# Patient Record
Sex: Male | Born: 1995 | Race: White | Hispanic: No | Marital: Single | Smoking: Never smoker
Health system: Southern US, Community
[De-identification: ages and names within clinical notes are randomized; demographics above are authoritative.]

## PROBLEM LIST (undated history)

## (undated) DIAGNOSIS — J45909 Unspecified asthma, uncomplicated: Secondary | ICD-10-CM

---
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
## 2018-01-06 ENCOUNTER — Emergency Department (HOSPITAL_COMMUNITY)
Admission: EM | Admit: 2018-01-06 | Discharge: 2018-01-06 | Disposition: A | Discharge status: Home / Self Care | Payer: Self-pay | Attending: Emergency Medicine | Admitting: Emergency Medicine

## 2018-01-06 ENCOUNTER — Other Ambulatory Visit: Payer: Self-pay

## 2018-01-06 ENCOUNTER — Emergency Department (HOSPITAL_COMMUNITY): Payer: Self-pay

## 2018-01-06 ENCOUNTER — Encounter (HOSPITAL_COMMUNITY): Payer: Self-pay | Admitting: Emergency Medicine

## 2018-01-06 DIAGNOSIS — R569 Unspecified convulsions: Secondary | ICD-10-CM | POA: Insufficient documentation

## 2018-01-06 DIAGNOSIS — J45909 Unspecified asthma, uncomplicated: Secondary | ICD-10-CM | POA: Insufficient documentation

## 2018-01-06 LAB — CBC WITH DIFFERENTIAL/PLATELET
ABS IMMATURE GRANULOCYTES: 0 10*3/uL (ref 0.0–0.1)
BASOS ABS: 0.1 10*3/uL (ref 0.0–0.1)
BASOS PCT: 1 %
Eosinophils Absolute: 0.1 10*3/uL (ref 0.0–0.7)
Eosinophils Relative: 1 %
HCT: 46.3 % (ref 39.0–52.0)
HEMOGLOBIN: 15.7 g/dL (ref 13.0–17.0)
Immature Granulocytes: 0 %
LYMPHS PCT: 23 %
Lymphs Abs: 2.1 10*3/uL (ref 0.7–4.0)
MCH: 32.4 pg (ref 26.0–34.0)
MCHC: 33.9 g/dL (ref 30.0–36.0)
MCV: 95.5 fL (ref 78.0–100.0)
Monocytes Absolute: 0.7 10*3/uL (ref 0.1–1.0)
Monocytes Relative: 7 %
NEUTROS ABS: 6.1 10*3/uL (ref 1.7–7.7)
NEUTROS PCT: 68 %
PLATELETS: 202 10*3/uL (ref 150–400)
RBC: 4.85 MIL/uL (ref 4.22–5.81)
RDW: 11.4 % — ABNORMAL LOW (ref 11.5–15.5)
WBC: 9.1 10*3/uL (ref 4.0–10.5)

## 2018-01-06 LAB — URINALYSIS, ROUTINE W REFLEX MICROSCOPIC
BILIRUBIN URINE: NEGATIVE
Glucose, UA: NEGATIVE mg/dL
Hgb urine dipstick: NEGATIVE
Ketones, ur: NEGATIVE mg/dL
Leukocytes, UA: NEGATIVE
NITRITE: NEGATIVE
PROTEIN: NEGATIVE mg/dL
SPECIFIC GRAVITY, URINE: 1.011 (ref 1.005–1.030)
pH: 7 (ref 5.0–8.0)

## 2018-01-06 LAB — RAPID URINE DRUG SCREEN, HOSP PERFORMED
Amphetamines: NOT DETECTED
Barbiturates: NOT DETECTED
Benzodiazepines: NOT DETECTED
COCAINE: NOT DETECTED
OPIATES: NOT DETECTED
TETRAHYDROCANNABINOL: POSITIVE — AB

## 2018-01-06 LAB — COMPREHENSIVE METABOLIC PANEL
ALT: 15 U/L (ref 0–44)
ANION GAP: 8 (ref 5–15)
AST: 17 U/L (ref 15–41)
Albumin: 4.3 g/dL (ref 3.5–5.0)
Alkaline Phosphatase: 45 U/L (ref 38–126)
BUN: 12 mg/dL (ref 6–20)
CO2: 26 mmol/L (ref 22–32)
Calcium: 9.3 mg/dL (ref 8.9–10.3)
Chloride: 106 mmol/L (ref 98–111)
Creatinine, Ser: 1.07 mg/dL (ref 0.61–1.24)
Glucose, Bld: 92 mg/dL (ref 70–99)
POTASSIUM: 3.6 mmol/L (ref 3.5–5.1)
Sodium: 140 mmol/L (ref 135–145)
Total Bilirubin: 1.3 mg/dL — ABNORMAL HIGH (ref 0.3–1.2)
Total Protein: 6.7 g/dL (ref 6.5–8.1)

## 2018-01-06 LAB — ETHANOL

## 2018-01-06 MED ORDER — SODIUM CHLORIDE 0.9 % IV BOLUS
1000.0000 mL | Freq: Once | INTRAVENOUS | Status: AC
  Administered 2018-01-06: 1000 mL via INTRAVENOUS

## 2018-01-06 NOTE — ED Notes (Signed)
Pt ambulated without difficulty. Gait steady and even. Patient verbalizes understanding of discharge instructions. Opportunity for questioning and answers were provided.

## 2018-01-06 NOTE — Discharge Instructions (Addendum)
Follow-up with the referred neurology for evaluation of possible seizure activity.   Do not drive until you have been evaluated by neurology.   Low up with your primary care doctor in the next 2 to 4 days for further evaluation.  Return the emergency department for any pain, vision change, difficulty walking, numbness/weakness of your arms or legs, chest pain, difficulty breathing or any other worsening or concerning symptoms.
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
## 2018-01-06 NOTE — ED Triage Notes (Signed)
Patient arrived via EMS. Seizure-like activity reported at gas station. Postictal state, presents drowsy. One incontinent episode. Reports not remembering for a moment at gas station, and felt as if he could not breath. Currently alert X4. PA at bedside. Seizure pads placed on bed. Denies pain.

## 2019-03-08 ENCOUNTER — Emergency Department
Admission: EM | Admit: 2019-03-08 | Discharge: 2019-03-08 | Disposition: A | Discharge status: Home / Self Care | Payer: 59 | Attending: Student | Admitting: Student

## 2019-03-08 ENCOUNTER — Other Ambulatory Visit: Payer: Self-pay

## 2019-03-08 ENCOUNTER — Encounter: Payer: Self-pay | Admitting: Emergency Medicine

## 2019-03-08 DIAGNOSIS — J45909 Unspecified asthma, uncomplicated: Secondary | ICD-10-CM | POA: Insufficient documentation

## 2019-03-08 DIAGNOSIS — H1033 Unspecified acute conjunctivitis, bilateral: Secondary | ICD-10-CM | POA: Insufficient documentation

## 2019-03-08 DIAGNOSIS — H11433 Conjunctival hyperemia, bilateral: Secondary | ICD-10-CM | POA: Diagnosis present

## 2019-03-08 NOTE — Discharge Instructions (Addendum)
Follow-up with [facility] at 1:30 PM today.  Do not use any more eyedrops.

## 2019-03-08 NOTE — ED Triage Notes (Signed)
Presents with right eye drainage and swelling  Was seen at [facility] last Thursday  Placed on meds   But doesn't feel like it is working

## 2019-03-08 NOTE — ED Provider Notes (Signed)
[facility] Emergency Department Provider Note  ____________________________________________   First MD Initiated Contact with Patient 03/08/19 1055     (approximate)  I have reviewed the triage vital signs and the nursing notes.   HISTORY  Chief Complaint Eye Pain    HPI Adam Jenkins is a 23 y.o. male presents emergency department complaint of eye drainage and swelling.  He was seen last Thursday at [facility].  They started him on Cipro eyedrops and clindamycin oral medication.  They thought maybe it was something to do with his ducts.  He denies any known eye injury.  He states his eyes still hurt and he is very sensitive to light.  No fever or chills.    Past Medical History:  Diagnosis Date  . Asthma     Patient Active Problem List   Diagnosis Date Noted  . VISUAL ACUITY, DECREASED 01/06/2010    History reviewed. No pertinent surgical history.  Prior to Admission medications   Medication Sig Start Date End Date Taking? Authorizing Provider  clindamycin (CLEOCIN) 300 MG capsule Take 300 mg by mouth 3 (three) times daily.   Yes [provider]  diphenhydrAMINE (BENADRYL) 25 MG tablet Take 25 mg by mouth every 6 (six) hours as needed for allergies (cough).    [provider]    Allergies Patient has no known allergies.  No family history on file.  Social History Social History   Tobacco Use  . Smoking status: Never Smoker  . Smokeless tobacco: Never Used  Substance Use Topics  . Alcohol use: Yes    Comment: occassionally  . Drug use: No    Review of Systems  Constitutional: No fever/chills Eyes: No visual changes.  Positive photophobia and eye redness with clear watery drainage ENT: No sore throat. Respiratory: Denies cough Genitourinary: Negative for dysuria. Musculoskeletal: Negative for back pain. Skin: Negative for rash.    ____________________________________________   PHYSICAL EXAM:   VITAL SIGNS: ED Triage Vitals  Enc Vitals Group     BP 03/08/19 1110 126/75     Pulse Rate 03/08/19 1110 78     Resp 03/08/19 1110 18     Temp 03/08/19 1110 98 F (36.7 C)     Temp Source 03/08/19 1110 Oral     SpO2 03/08/19 1110 99 %     Weight 03/08/19 1106 151 lb (68.5 kg)     Height 03/08/19 1106 5\' 9"  (1.753 m)     Head Circumference --      Peak Flow --      Pain Score 03/08/19 1106 2     Pain Loc --      Pain Edu? --      Excl. in GC? --     Constitutional: Alert and oriented. Well appearing and in no acute distress. Eyes: Conjunctivae are injected bilaterally, clear drainage is noted from both eyes.  No periorbital edema is noted.   Head: Atraumatic. Nose: No congestion/rhinnorhea. Mouth/Throat: Mucous membranes are moist.   Neck:  supple no lymphadenopathy noted Cardiovascular: Normal rate, regular rhythm. Heart sounds are normal Respiratory: Normal respiratory effort.  No retractions, lungs c t a  GU: deferred Musculoskeletal: FROM all extremities, warm and well perfused Neurologic:  Normal speech and language.  Skin:  Skin is warm, dry and intact. No rash noted. Psychiatric: Mood and affect are normal. Speech and behavior are normal.  ____________________________________________   LABS (all labs ordered are listed, but only abnormal results are displayed)  Labs Reviewed - No data to display ____________________________________________   ____________________________________________  RADIOLOGY    ____________________________________________   PROCEDURES  Procedure(s) performed: No  Procedures    ____________________________________________   INITIAL IMPRESSION / ASSESSMENT AND PLAN / ED COURSE  Pertinent labs & imaging results that were available during my care of the patient were reviewed by me and considered in my medical decision making (see chart for details).   Patient is a 23 year old male presents emergency department complaining of  worsening conjunctivitis.  Patient has been on Cipro ophthalmic drops since Thursday of last week and clindamycin orally.  Physical exam shows diffuse redness of conjunctiva in both eyes with clear watery drainage.  Patient is very sensitive to light.  Paged Dr. Wallace Going.  He will see the patient in the office this afternoon.  Stop all eyedrops at this time.  Explained this to the patient.  He is going to follow-up Dr. Christin Fudge at 1:30 PM today.  He was discharged in stable condition.    Adam Jenkins was evaluated in Emergency Department on 03/08/2019 for the symptoms described in the history of present illness. He was evaluated in the context of the global COVID-19 pandemic, which necessitated consideration that the patient might be at risk for infection with the SARS-CoV-2 virus that causes COVID-19. Institutional protocols and algorithms that pertain to the evaluation of patients at risk for COVID-19 are in a state of rapid change based on information released by regulatory bodies including the CDC and federal and state organizations. These policies and algorithms were followed during the patient's care in the ED.   As part of my medical decision making, I reviewed the following data within the [facility] notes reviewed and incorporated, Old chart reviewed, A consult was requested and obtained from this/these consultant(s) ophthalmology, Notes from prior ED visits and [place] Controlled Substance Database  ____________________________________________   FINAL CLINICAL IMPRESSION(S) / ED DIAGNOSES  Final diagnoses:  Acute conjunctivitis of both eyes, unspecified acute conjunctivitis type      NEW MEDICATIONS STARTED DURING THIS VISIT:  New Prescriptions   No medications on file     Note:  This document was prepared using Dragon voice recognition software and may include unintentional dictation errors.    Versie Starks, PA-C 03/08/19 1208    Lilia Pro., MD 03/08/19 [phone]

## 2019-12-22 IMAGING — CT CT HEAD W/O CM
3 series · 15 of 47 positions shown, 18 images · non-contrast
Comparison: September 19, 2012

CLINICAL DATA: Acute onset seizure

EXAM:
CT HEAD WITHOUT CONTRAST
TECHNIQUE: Contiguous axial images were obtained from the base of the skull
through the vertex without intravenous contrast.

[Series 3: head 5.0 h30s · axial · 0.42mm/px · z∈[-52,+73]mm · 9 of 31 slices shown, 12 images]
[im 3/31  brain]
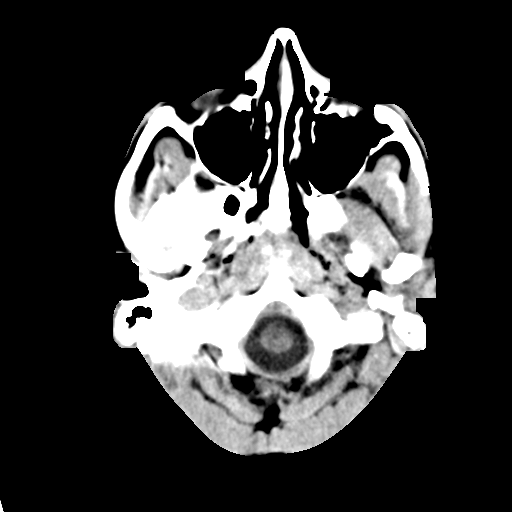
[im 3/31  bone]
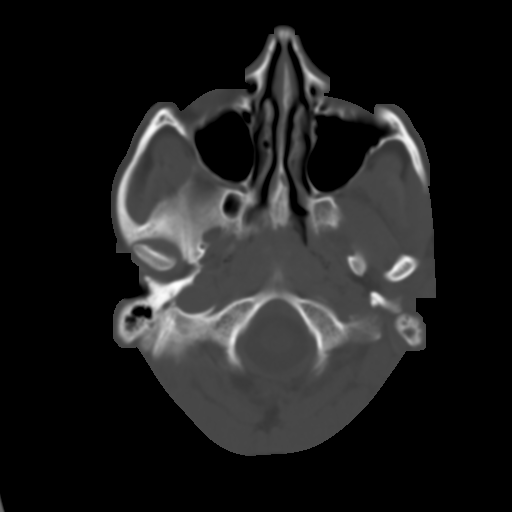
[im 6/31  brain]
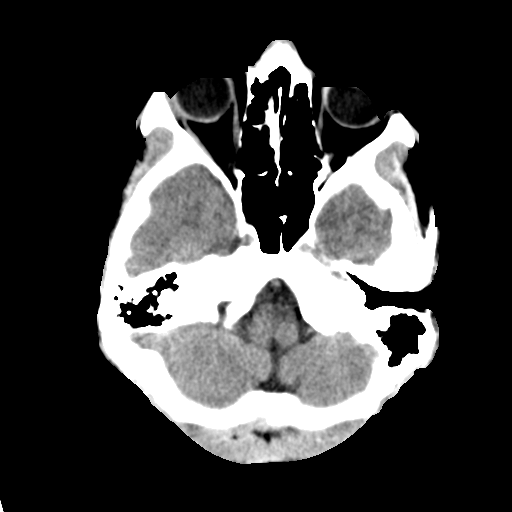
[im 9/31  brain]
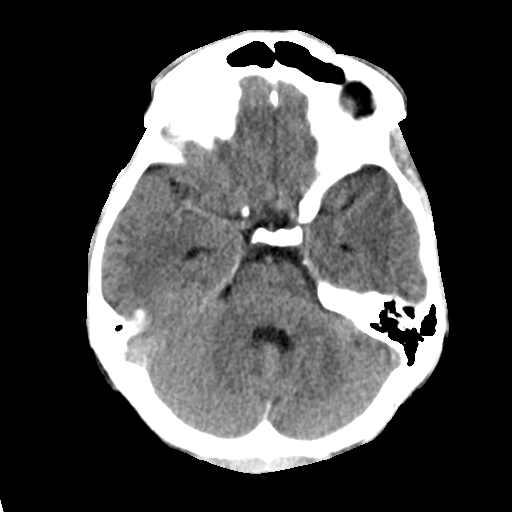
[im 12/31  brain]
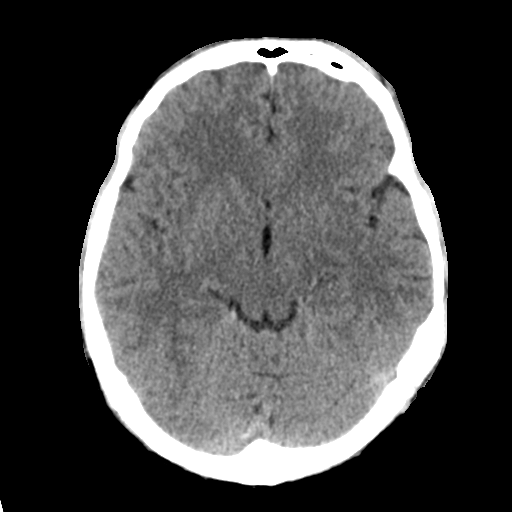
[im 16/31  brain]
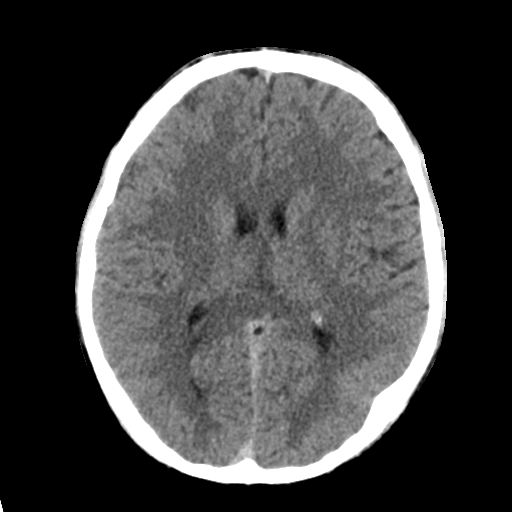
[im 16/31  bone]
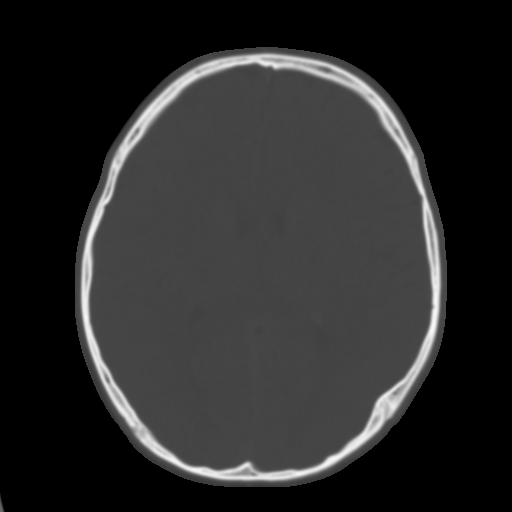
[im 19/31  brain]
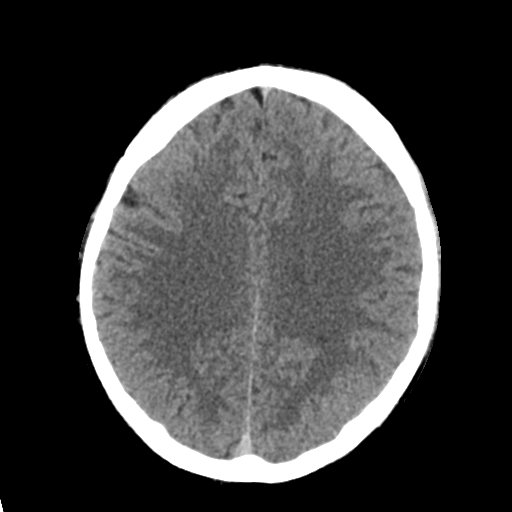
[im 22/31  brain]
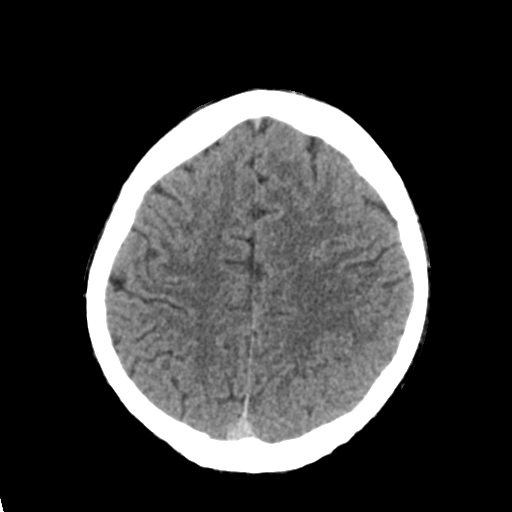
[im 25/31  brain]
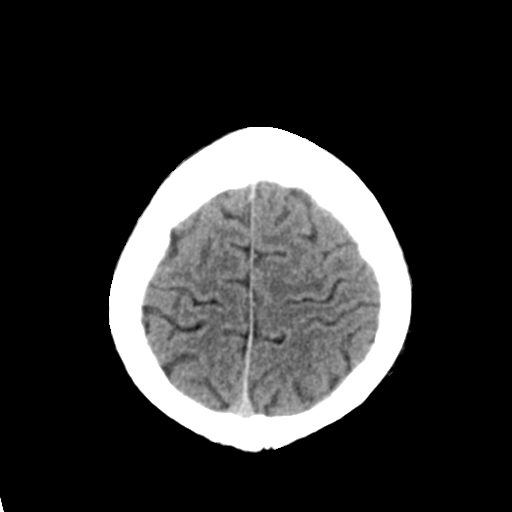
[im 28/31  brain]
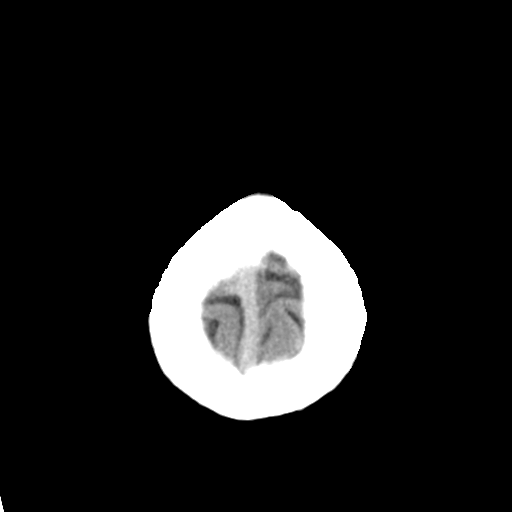
[im 28/31  bone]
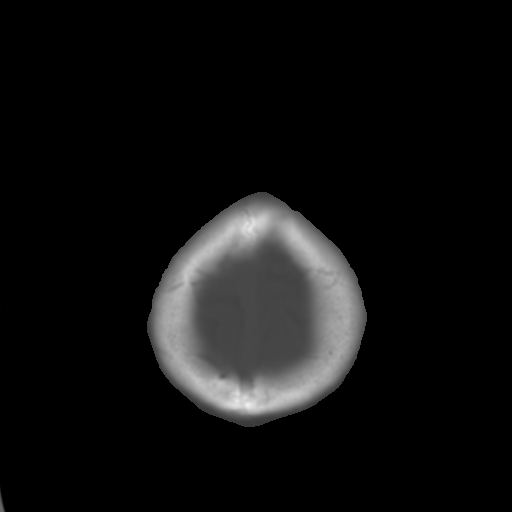

[Series 5: head 3.0 mpr cor · coronal · 0.32mm/px · 3 of 67 slices shown]
[im 23/67  brain]
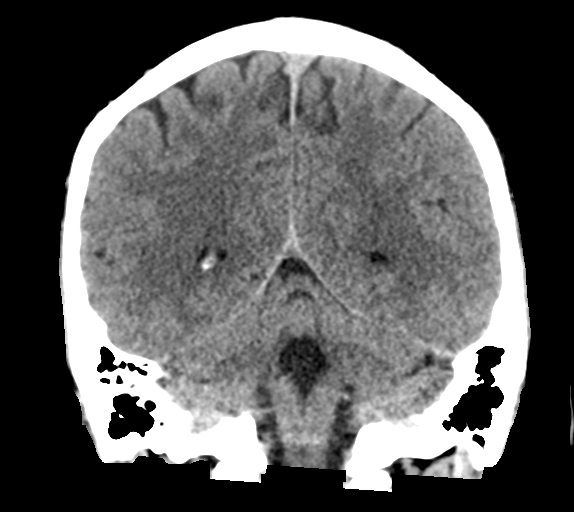
[im 30/67  brain]
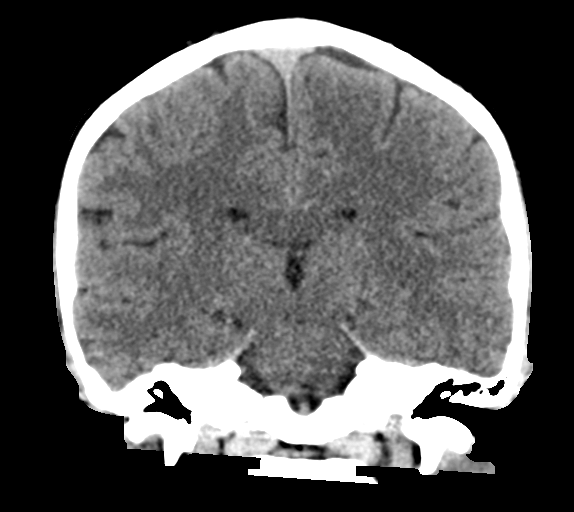
[im 37/67  brain]
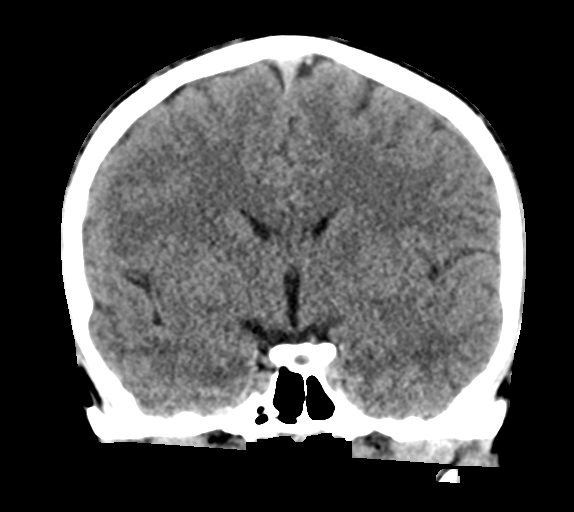

[Series 6: head 3.0 mpr sag · sagittal · 0.32mm/px · 3 of 56 slices shown]
[im 19/56  brain]
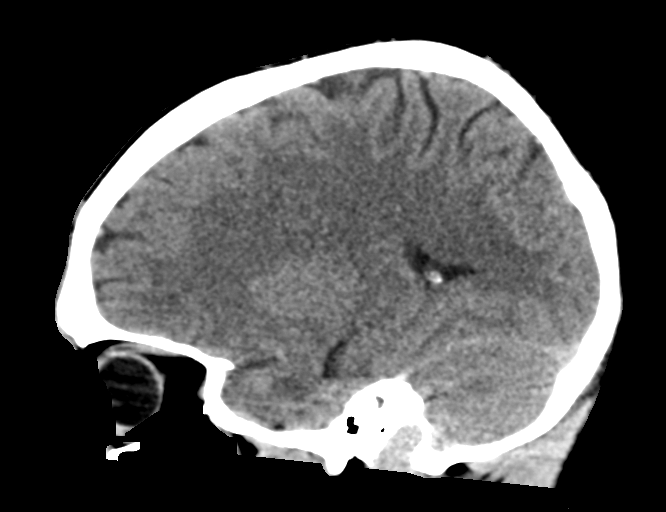
[im 28/56  brain]
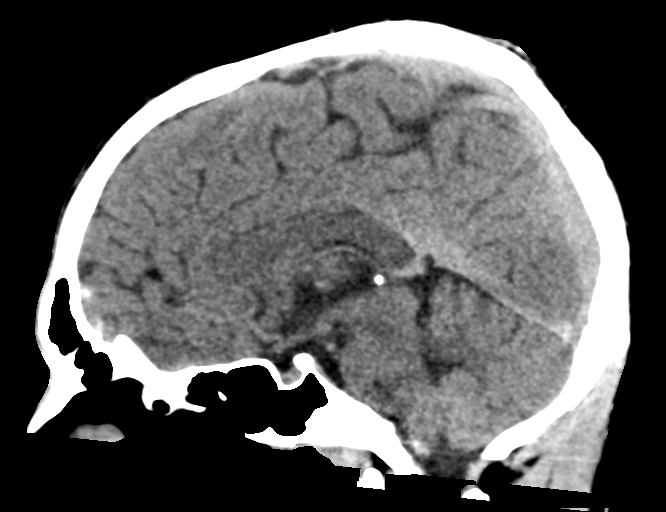
[im 37/56  brain]
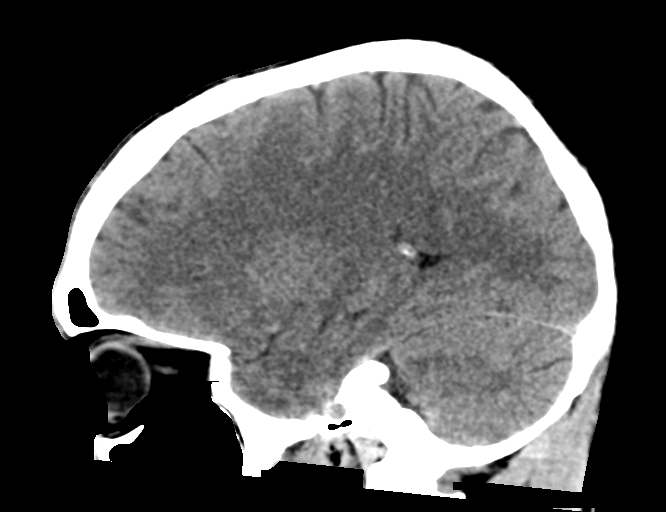

[15 of 47 positions shown; findings below may reference images not displayed]

FINDINGS: Brain: The ventricles are normal in size and configuration. There is
no intracranial mass, hemorrhage, extra-axial fluid collection, or
midline shift. Gray-white compartments appear normal. No acute
infarct evident.

Vascular: No hyperdense vessel.  No evident vascular calcification.

Skull: The bony calvarium a appears intact.

Sinuses/Orbits: There is opacification and mucosal thickening in
several ethmoid air cells bilaterally. There is mucosal thickening
and opacification in the inferior left frontal sinus. Other
visualized paranasal sinuses are clear. Orbits appear symmetric
bilaterally.

Other: Mastoid air cells are clear.
IMPRESSION: Areas of paranasal sinus disease.  Study otherwise unremarkable.

## 2020-05-10 ENCOUNTER — Other Ambulatory Visit (HOSPITAL_COMMUNITY): Payer: Self-pay | Admitting: Family Medicine

## 2020-05-10 ENCOUNTER — Other Ambulatory Visit: Payer: Self-pay | Admitting: Family Medicine

## 2020-05-10 DIAGNOSIS — N5082 Scrotal pain: Secondary | ICD-10-CM

## 2020-05-11 ENCOUNTER — Other Ambulatory Visit: Payer: Self-pay | Admitting: Family Medicine

## 2020-05-11 DIAGNOSIS — B354 Tinea corporis: Secondary | ICD-10-CM

## 2020-05-11 DIAGNOSIS — N5082 Scrotal pain: Secondary | ICD-10-CM

## 2020-05-17 ENCOUNTER — Ambulatory Visit (HOSPITAL_COMMUNITY): Payer: 59

## 2020-05-24 ENCOUNTER — Other Ambulatory Visit: Payer: Self-pay

## 2020-05-24 ENCOUNTER — Ambulatory Visit (HOSPITAL_COMMUNITY)
Admission: RE | Admit: 2020-05-24 | Discharge: 2020-05-24 | Disposition: A | Discharge status: Home / Self Care | Payer: 59 | Source: Ambulatory Visit | Attending: Family Medicine | Admitting: Family Medicine

## 2020-05-24 DIAGNOSIS — N5082 Scrotal pain: Secondary | ICD-10-CM | POA: Insufficient documentation

## 2020-05-24 DIAGNOSIS — B354 Tinea corporis: Secondary | ICD-10-CM | POA: Insufficient documentation

## 2023-04-16 DIAGNOSIS — F32A Depression, unspecified: Secondary | ICD-10-CM | POA: Diagnosis not present

## 2023-04-16 DIAGNOSIS — F419 Anxiety disorder, unspecified: Secondary | ICD-10-CM | POA: Diagnosis not present

## 2023-05-12 ENCOUNTER — Telehealth: Payer: Self-pay | Admitting: Family Medicine

## 2023-05-12 NOTE — Telephone Encounter (Signed)
Patients mother called very upset and states would like to hear directly from the Practice Admin. She was very upset that her sons new patient appt had to be rescheduled due to Dr. Janee Morn having an emergency. I offered an appt for Jan and she states no because would have to pay more for the visit since she is a [employer] employee and with the insurance change, she would have a high deductible.  She states she didn't ask for the appt to be canceled and she shouldn't be penalized because he could not come into work.

## 2023-05-12 NOTE — Telephone Encounter (Signed)
LMOVM offering her 12/23 at 11 am or 12/31 at 10 am. I have holds on those times currently.

## 2023-05-13 ENCOUNTER — Ambulatory Visit: Payer: Self-pay | Admitting: Family Medicine

## 2023-05-15 NOTE — Telephone Encounter (Signed)
Lvmtcb to schedule a NP appt.

## 2023-11-21 DIAGNOSIS — J09X3 Influenza due to identified novel influenza A virus with gastrointestinal manifestations: Secondary | ICD-10-CM | POA: Diagnosis not present

## 2023-11-21 DIAGNOSIS — J029 Acute pharyngitis, unspecified: Secondary | ICD-10-CM | POA: Diagnosis not present

## 2023-11-21 DIAGNOSIS — R1084 Generalized abdominal pain: Secondary | ICD-10-CM | POA: Diagnosis not present

## 2023-11-21 DIAGNOSIS — R197 Diarrhea, unspecified: Secondary | ICD-10-CM | POA: Diagnosis not present
